# Patient Record
Sex: Male | Born: 2000 | Race: White | Hispanic: No | Marital: Single | Smoking: Never smoker
Health system: Southern US, Community
[De-identification: ages and names within clinical notes are randomized; demographics above are authoritative.]

---
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
## 2020-07-14 ENCOUNTER — Encounter: Payer: Self-pay | Admitting: Emergency Medicine

## 2020-07-14 ENCOUNTER — Ambulatory Visit
Admission: EM | Admit: 2020-07-14 | Discharge: 2020-07-14 | Disposition: A | Discharge status: Home / Self Care | Payer: PRIVATE HEALTH INSURANCE | Attending: Physician Assistant | Admitting: Physician Assistant

## 2020-07-14 ENCOUNTER — Other Ambulatory Visit: Payer: Self-pay

## 2020-07-14 DIAGNOSIS — R109 Unspecified abdominal pain: Secondary | ICD-10-CM

## 2020-07-14 LAB — POCT URINALYSIS DIP (MANUAL ENTRY)
Bilirubin, UA: NEGATIVE
Blood, UA: NEGATIVE
Glucose, UA: NEGATIVE mg/dL
Ketones, POC UA: NEGATIVE mg/dL
Leukocytes, UA: NEGATIVE
Nitrite, UA: NEGATIVE
Protein Ur, POC: NEGATIVE mg/dL
Spec Grav, UA: 1.025 (ref 1.010–1.025)
Urobilinogen, UA: 0.2 E.U./dL
pH, UA: 7.5 (ref 5.0–8.0)

## 2020-07-14 MED ORDER — TAMSULOSIN HCL 0.4 MG PO CAPS: 0.4000 mg | ORAL_CAPSULE | Freq: Every day | ORAL | 0 refills | Status: AC

## 2020-07-14 NOTE — Discharge Instructions (Signed)
Push fluids Take Flomax as prescribed If symptoms become severe or you develop fever, chills, nausea, or vomiting follow up in Emergency Department

## 2020-07-14 NOTE — ED Provider Notes (Signed)
[facility]    CSN: [id number] Arrival date & time: 07/14/20  5284      History   Chief Complaint Chief Complaint  Patient presents with  . Dysuria  . Flank Pain    HPI Collin Patel is a 20 y.o. male.   Pt complains of left flank pain that started about one week ago.  He reports last week he also experienced dysuria.  This has improved.  He was seen in student health clinic and tested for STIs on Wednesday, negative results.  He reports a family history of kidney stones and is worried that he has one now.  No personal history of kidney stones.  He denies fever, chills, n/v/d, abdominal pain, penile discharge.  Denies recent injury or trauma.      History reviewed. No pertinent past medical history.  There are no problems to display for this patient.   History reviewed. No pertinent surgical history.     Home Medications    Prior to Admission medications   Medication Sig Start Date End Date Taking? Authorizing Provider  cetirizine (ZYRTEC) 10 MG tablet Take by mouth.   Yes [provider]  tamsulosin (FLOMAX) 0.4 MG CAPS capsule Take 1 capsule (0.4 mg total) by mouth daily. 07/14/20  Yes Akim Watkinson, PA-C  acetaminophen (TYLENOL) 500 MG tablet Take by mouth.    [provider]  amoxicillin-clavulanate (AUGMENTIN) 875-125 MG tablet TK 1 T PO Q 12 H FOR 10 DAYS 10/31/15   [provider]  guaiFENesin-codeine (ROBITUSSIN AC) 100-10 MG/5ML syrup Take by mouth. 10/31/15   [provider]  ibuprofen (ADVIL,MOTRIN) 200 MG tablet Take by mouth.    [provider]    Family History History reviewed. No pertinent family history.  Social History Social History   Tobacco Use  . Smoking status: Never Smoker  . Smokeless tobacco: Never Used  Substance Use Topics  . Alcohol use: Yes    Comment: occasionally   . Drug use: Never     Allergies   Peanut oil   Review of Systems Review of Systems   Constitutional: Negative for chills and fever.  HENT: Negative for ear pain and sore throat.   Eyes: Negative for pain and visual disturbance.  Respiratory: Negative for cough and shortness of breath.   Cardiovascular: Negative for chest pain and palpitations.  Gastrointestinal: Negative for abdominal pain and vomiting.  Genitourinary: Positive for flank pain (left). Negative for difficulty urinating, dysuria, frequency, hematuria, penile discharge and penile pain.  Musculoskeletal: Negative for arthralgias and back pain.  Skin: Negative for color change and rash.  Neurological: Negative for seizures and syncope.  All other systems reviewed and are negative.    Physical Exam Triage Vital Signs ED Triage Vitals  Enc Vitals Group     BP 07/14/20 1041 128/82     Pulse Rate 07/14/20 1041 66     Resp 07/14/20 1041 16     Temp 07/14/20 1041 98.1 F (36.7 C)     Temp Source 07/14/20 1041 Oral     SpO2 07/14/20 1041 97 %     Weight 07/14/20 1038 215 lb (97.5 kg)     Height 07/14/20 1038 6\' 1"  (1.854 m)     Head Circumference --      Peak Flow --      Pain Score 07/14/20 1038 6     Pain Loc --      Pain Edu? --      Excl.  in GC? --    No data found.  Updated Vital Signs BP 128/82 (BP Location: Left Arm)   Pulse 66   Temp 98.1 F (36.7 C) (Oral)   Resp 16   Ht 6\' 1"  (1.854 m)   Wt 215 lb (97.5 kg)   SpO2 97%   BMI 28.37 kg/m   Visual Acuity Right Eye Distance:   Left Eye Distance:   Bilateral Distance:    Right Eye Near:   Left Eye Near:    Bilateral Near:     Physical Exam Vitals and nursing note reviewed.  Constitutional:      Appearance: He is well-developed and well-nourished.  HENT:     Head: Normocephalic and atraumatic.  Eyes:     Conjunctiva/sclera: Conjunctivae normal.  Cardiovascular:     Rate and Rhythm: Normal rate and regular rhythm.     Heart sounds: No murmur heard.   Pulmonary:     Effort: Pulmonary effort is normal. No respiratory  distress.     Breath sounds: Normal breath sounds.  Abdominal:     General: Bowel sounds are normal.     Palpations: Abdomen is soft.     Tenderness: There is no abdominal tenderness. There is no right CVA tenderness or left CVA tenderness.  Musculoskeletal:        General: No edema.     Cervical back: Neck supple.  Skin:    General: Skin is warm and dry.  Neurological:     Mental Status: He is alert.  Psychiatric:        Mood and Affect: Mood and affect normal.      UC Treatments / Results  Labs (all labs ordered are listed, but only abnormal results are displayed) Labs Reviewed  POCT URINALYSIS DIP (MANUAL ENTRY)    EKG   Radiology No results found.  Procedures Procedures (including critical care time)  Medications Ordered in UC Medications - No data to display  Initial Impression / Assessment and Plan / UC Course  I have reviewed the triage vital signs and the nursing notes.  Pertinent labs & imaging results that were available during my care of the patient were reviewed by me and considered in my medical decision making (see chart for details).     UA normal here today.  No CVA tenderness on exam, no musculoskeletal tenderness to palpation.  Advised to continue to monitor symptoms and push fluids.  Strict return precautions given.  Will prescribe Flomax to take in case he does have a kidney stone.   Final Clinical Impressions(s) / UC Diagnoses   Final diagnoses:  Left flank pain     Discharge Instructions     Push fluids Take Flomax as prescribed If symptoms become severe or you develop fever, chills, nausea, or vomiting follow up in Emergency Department    ED Prescriptions    Medication Sig Dispense Auth. Provider   tamsulosin (FLOMAX) 0.4 MG CAPS capsule Take 1 capsule (0.4 mg total) by mouth daily. 14 capsule , PA-C     PDMP not reviewed this encounter.   Jodell Cipro, PA-C 07/14/20 1118

## 2020-07-14 NOTE — ED Triage Notes (Signed)
Patient c/o LFT sided flank pain and dysuria x 1 week.   Patient denies any hematuria, increased frequency, or foul smelling.  Patient denies penile drainage.   Patient reports a family history of kidney stones.   Patient endorses taking a cranberry probiotic for symptoms w/ some relief of symptoms.
